# Patient Record
Sex: Male | Born: 1980 | Race: White | Hispanic: No | Marital: Married | Smoking: Never smoker
Health system: Southern US, Community
[De-identification: ages and names within clinical notes are randomized; demographics above are authoritative.]

## PROBLEM LIST (undated history)

## (undated) DIAGNOSIS — G629 Polyneuropathy, unspecified: Secondary | ICD-10-CM

## (undated) HISTORY — DX: Polyneuropathy, unspecified: G62.9

---

## 2012-09-30 ENCOUNTER — Encounter (HOSPITAL_BASED_OUTPATIENT_CLINIC_OR_DEPARTMENT_OTHER): Payer: BC Managed Care – PPO | Attending: General Surgery

## 2012-09-30 DIAGNOSIS — G589 Mononeuropathy, unspecified: Secondary | ICD-10-CM | POA: Insufficient documentation

## 2012-09-30 DIAGNOSIS — L97509 Non-pressure chronic ulcer of other part of unspecified foot with unspecified severity: Secondary | ICD-10-CM | POA: Insufficient documentation

## 2012-10-16 ENCOUNTER — Encounter (HOSPITAL_BASED_OUTPATIENT_CLINIC_OR_DEPARTMENT_OTHER): Payer: BC Managed Care – PPO | Attending: General Surgery

## 2013-08-12 ENCOUNTER — Ambulatory Visit (INDEPENDENT_AMBULATORY_CARE_PROVIDER_SITE_OTHER): Payer: BC Managed Care – PPO

## 2013-08-12 VITALS — BP 122/70 | HR 86 | Resp 12

## 2013-08-12 DIAGNOSIS — B351 Tinea unguium: Secondary | ICD-10-CM

## 2013-08-12 DIAGNOSIS — L97509 Non-pressure chronic ulcer of other part of unspecified foot with unspecified severity: Secondary | ICD-10-CM

## 2013-08-12 DIAGNOSIS — G609 Hereditary and idiopathic neuropathy, unspecified: Secondary | ICD-10-CM

## 2013-08-12 DIAGNOSIS — G629 Polyneuropathy, unspecified: Secondary | ICD-10-CM

## 2013-08-12 MED ORDER — TAVABOROLE 5 % EX SOLN: CUTANEOUS | Status: DC

## 2013-08-12 NOTE — Progress Notes (Signed)
   Subjective:    Patient ID: Harold Miller, male    DOB: 03/09/1981, 33 y.o.   MRN: [id number]  HPI '' RT FOOT GREAT TOE HAVE AN OPEN SORE FOR 1.5 YEAR. THE TOE IS BEEN THE SAME, BUT IS NOT GETTING WORSE.  DR. PRESCRIBE BACTRIM BUT THE TOE IS NOT GETTING BETTER.    Review of Systems  All other systems reviewed and are negative.       Objective:   Physical Exam Okay objective findings follows vascular status intact pedal pulses palpable DP and PT posterior were for patient has a hemorrhage a blister first digit IP joint plantar medial right great toe. Patient's emergency room and placed months antibiotic ointment is to be getting better at this point there was a red streak which is clear neck she before he started on antibiotic. Currently is been applying Bactroban ointment and dressings to the toe patient wearing a Darco wedge shoe to try to offload. Patient does have a history of some neuropathy decreased sensations confirmed on Semmes Weinstein testing otherwise normal plantar response are noted DTRs not listed. Patient does drink alcohol although not a huge amount of pretty consistent of alcohol daily. 3 or 4 drinks a day. This may be contributing to his neuropathy and indicates he had nerve conduction studies done in the past summer he rash broke was told it wasn't conclusive formula re\re center for testing its been a year and half and hasn't nerve problem seems to be getting worse. Patient also thick and dystrophy of the nails per to hallux nails bilateral lesser digits with thickening yellowing and brittleness requesting topical treatment tried topical creams over-the-counter with no success orthopedic biomechanical exam reveals rectus foot type ankle mid tarsus subtalar joint motions normal there is approximately 0.5 cm ulceration plantar medial hallux IP joint with hemorrhage a keratosis around the periphery.      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 I does or gauze dressing are applied patient is using Bactroban ointment at home and doing dressing changes daily as instructed will allow for handicap parking placard may stop working for 2 weeks to allow this to heal he does a lot of walking around [employer] campus every day and this is likely not to allow this wound to heal. Stress to that offloading with neuropathy to be the most important treatment aspect get this to heal despite the diabetic. At this time we'll continue to be person the ulcer is debrided Iodosorb dressing is applied recheck in 3-4 weeks for followup prescription for Kerydin topical nail antifungal is also given and 40 to pharmacy. Next  Alvan Dameichard Rina Adney DPM

## 2013-08-12 NOTE — Patient Instructions (Signed)
ANTIBACTERIAL SOAP INSTRUCTIONS  THE DAY AFTER PROCEDURE  Please follow the instructions your doctor has marked.   Shower as usual. Before getting out, place a drop of antibacterial liquid soap (Dial) on a wet, clean washcloth.  Gently wipe washcloth over affected area.  Afterward, rinse the area with warm water.  Blot the area dry with a soft cloth and cover with antibiotic ointment (neosporin, polysporin, bacitracin) and band aid or gauze and tape  Place 3-4 drops of antibacterial liquid soap in a quart of warm tap water.  Submerge foot into water for 20 minutes.  If bandage was applied after your procedure, leave on to allow for easy lift off, then remove and continue with soak for the remaining time.  Next, blot area dry with a soft cloth and cover with a bandage.  After washing and drying thoroughly apply

## 2013-09-09 ENCOUNTER — Ambulatory Visit (INDEPENDENT_AMBULATORY_CARE_PROVIDER_SITE_OTHER): Payer: BC Managed Care – PPO

## 2013-09-09 VITALS — BP 118/71 | HR 82 | Temp 98.4°F | Resp 18

## 2013-09-09 DIAGNOSIS — G629 Polyneuropathy, unspecified: Secondary | ICD-10-CM

## 2013-09-09 DIAGNOSIS — L97509 Non-pressure chronic ulcer of other part of unspecified foot with unspecified severity: Secondary | ICD-10-CM

## 2013-09-09 NOTE — Patient Instructions (Signed)
ANTIBACTERIAL SOAP INSTRUCTIONS  THE DAY AFTER PROCEDURE  Please follow the instructions your doctor has marked.   Shower as usual. Before getting out, place a drop of antibacterial liquid soap (Dial) on a wet, clean washcloth.  Gently wipe washcloth over affected area.  Afterward, rinse the area with warm water.  Blot the area dry with a soft cloth and cover with antibiotic ointment (neosporin, polysporin, bacitracin) and band aid or gauze and tape  Place 3-4 drops of antibacterial liquid soap in a quart of warm tap water.  Submerge foot into water for 20 minutes.  If bandage was applied after your procedure, leave on to allow for easy lift off, then remove and continue with soak for the remaining time.  Next, blot area dry with a soft cloth and cover with a bandage.  Apply other medications as directed by your doctor, such as cortisporin otic solution (eardrops) or neosporin antibiotic ointment  Continue cleansing daily applying Bactroban ointment and a gauze and Band-Aid dressing and cushioned pad

## 2013-09-09 NOTE — Progress Notes (Signed)
   Subjective:    Patient ID: Harold Miller, male    DOB: 09/02/1980, 33 y.o.   MRN: [id number]  HPI  It may be a little better and there is not much draining on my big toe on the right    Review of Systems no systemic changes or findings noted     Objective:   Physical Exam Neurovascular status is intact and unchanged pedal pulses are palpable DP and PT +2/4 bilateral patient does continue to have a small ulceration about 2-3 mm ulceration on the hallux IP joint right great toe is greatly improved work as previously continue with Silvadene or Bactroban and gauze dressings daily some additional tube foam padding is also dispensed patient will bring shoes with the next visit may try to provided with some diabetic or Plastizote accommodative insoles for her shoes to prevent recurrence of ulceration       Assessment & Plan:  Ulcer hallux IP joint right great toe secondary peripheral neuropathy the ulcer is debrided down to dermal level does not go full thickness down to subcutaneous tissue at this point to with lumicain Silvadene and gauze dressing is applied reappointed in 2 weeks for pressure in 3 or 4 weeks for followup and continue with wound care as instructed may be candidate for diabetic insoles per memory foam insoles in the shoes  Alvan Dameichard Harjas Biggins DPM

## 2013-10-14 ENCOUNTER — Ambulatory Visit (INDEPENDENT_AMBULATORY_CARE_PROVIDER_SITE_OTHER): Payer: BC Managed Care – PPO

## 2013-10-14 VITALS — BP 113/70 | HR 80 | Resp 12

## 2013-10-14 DIAGNOSIS — G629 Polyneuropathy, unspecified: Secondary | ICD-10-CM

## 2013-10-14 DIAGNOSIS — L97509 Non-pressure chronic ulcer of other part of unspecified foot with unspecified severity: Secondary | ICD-10-CM

## 2013-10-14 NOTE — Patient Instructions (Signed)
ANTIBACTERIAL SOAP INSTRUCTIONS  THE DAY AFTER PROCEDURE  Please follow the instructions your doctor has marked.   Shower as usual. Before getting out, place a drop of antibacterial liquid soap (Dial) on a wet, clean washcloth.  Gently wipe washcloth over affected area.  Afterward, rinse the area with warm water.  Blot the area dry with a soft cloth and cover with antibiotic ointment (neosporin, polysporin, bacitracin) and band aid or gauze and tape  Place 3-4 drops of antibacterial liquid soap in a quart of warm tap water.  Submerge foot into water for 20 minutes.  If bandage was applied after your procedure, leave on to allow for easy lift off, then remove and continue with soak for the remaining time.  Next, blot area dry with a soft cloth and cover with a bandage.  Apply other medications as directed by your doctor, such as cortisporin otic solution (eardrops) or neosporin antibiotic ointment  Cleanse wound daily was soap and water and dried thoroughly apply antibiotic ointment such as muprocin and a gauze dressing as indicated until healed

## 2013-10-14 NOTE — Progress Notes (Signed)
   Subjective:    Patient ID: Harold Miller, male    DOB: [birth year], 33 y.o.   MRN: [id number]  HPI ''rt foot great toe is looking a little better.''   Review of Systems no new findings or systemic changes noted     Objective:   Physical Exam Neurovascular status is intact pedal pulses palpable patient does have decreased epicritic sensation confirmed to the toes the ulceration of the hallux IP joint his. Improved has been using Bactroban or Silvadene cream and dressing changes daily hemorrhage a keratoses debrided away this time down to dermal level still by 1 mm portion of ulceration present suggested continue with the Silvadene or Bactroban and Band-Aid dressing daily he may resume with a week resumed running [place] a bandage dressing tube foam pad. Discharge to an as-needed basis for followup      Assessment & Plan:  Assessment resolving ulceration right great toe secondary to peripheral neuropathy there support keratoses and ulceration debrided down to dermal level a 1 mm ulcer still present maintain in a bike ointment and a Band-Aid for another couple weeks and then discontinue if no additional discharge drainage it has not drained for 2 weeks now. 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 Discharge to an as-needed basis for followup at this time. Improving onychomycosis of nails also resolved ulceration right hallux  Alvan Dame DPM

## 2015-04-04 DIAGNOSIS — S52123A Displaced fracture of head of unspecified radius, initial encounter for closed fracture: Secondary | ICD-10-CM | POA: Insufficient documentation

## 2015-09-21 ENCOUNTER — Encounter: Payer: Self-pay | Admitting: Sports Medicine

## 2015-09-21 ENCOUNTER — Ambulatory Visit (INDEPENDENT_AMBULATORY_CARE_PROVIDER_SITE_OTHER): Payer: BC Managed Care – PPO | Admitting: Sports Medicine

## 2015-09-21 DIAGNOSIS — M79674 Pain in right toe(s): Secondary | ICD-10-CM

## 2015-09-21 DIAGNOSIS — L603 Nail dystrophy: Secondary | ICD-10-CM | POA: Diagnosis not present

## 2015-09-21 DIAGNOSIS — G588 Other specified mononeuropathies: Secondary | ICD-10-CM | POA: Diagnosis not present

## 2015-09-21 DIAGNOSIS — M79675 Pain in left toe(s): Secondary | ICD-10-CM | POA: Diagnosis not present

## 2015-09-21 NOTE — Progress Notes (Signed)
Patient ID: Harold Miller, male   DOB: 01-11-1981, 35 y.o.   MRN: [id number] Subjective: Harold Miller is a 35 y.o. male patient seen today in office with complaint of painful thickened and discolored nails. Patient is desiring treatment for nail changes; has tried OTC topicals/Medication in the past with no improvement. Patient desires big toenail removed and to figure out if there is any fungus in the lesser nails. Reports that nails are becoming difficult to manage because of the thickness. Patient has no other pedal complaints at this time.   Patient Active Problem List   Diagnosis Date Noted  . Closed fracture of head of radius 04/04/2015    Current Outpatient Prescriptions on File Prior to Visit  Medication Sig Dispense Refill  . mupirocin ointment (BACTROBAN) 2 % Place 1 application into the nose 2 (two) times daily.    Marland Kitchen sulfamethoxazole-trimethoprim (BACTRIM DS) 800-160 MG per tablet     . Tavaborole (KERYDIN) 5 % SOLN Apply 1 drop each affected nail once daily for 12 months 10 mL 10   No current facility-administered medications on file prior to visit.    No Known Allergies  Objective: Physical Exam  General: Well developed, nourished, no acute distress, awake, alert and oriented x 3  Vascular: Dorsalis pedis artery 2/4 bilateral, Posterior tibial artery 2/4 bilateral, skin temperature warm to warm proximal to distal bilateral lower extremities, no varicosities, pedal hair present bilateral.  Neurological: Gross sensation present via light touch bilateral. Protective and epicritic sensation severely diminished. Vibratory absent bilateral. History of idiopathic neuropathy  Dermatological: Skin is warm, dry, and supple bilateral, Nails 1-10 are tender, short thick, and discolored with mild subungal debris with the most involved nails being bilateral hallux left third through fifth toes, right second through fifth toes, no webspace macerations present bilateral, no open lesions  present bilateral, no callus/corns/hyperkeratotic tissue present bilateral. No signs of infection bilateral.  Musculoskeletal: No boney deformities noted bilateral. Muscular strength within normal limits without painon range of motion. No pain with calf compression bilateral.  Assessment and Plan:  Problem List Items Addressed This Visit    None    Visit Diagnoses    Nail dystrophy    -  Primary    Relevant Orders    Fungus Culture with Smear    Other mononeuropathy        Relevant Orders    Fungus Culture with Smear    Toe pain, bilateral        Relevant Orders    Fungus Culture with Smear       -Examined patient -Discussed treatment options for painful dystrophic nails  -Patient opt for permanent nail avulsion of bilateral hallux nails and for fungal culture of remaining nails - After a verbal consent, injected 3 ml of a 50:50 mixture of 2% plain  lidocaine and 0.5% plain marcaine in a normal hallux block fashion. Next, a  betadine prep was performed. Anesthesia was tested and found to be appropriate.  The right and left hallux nail was then incised from the hyponychium to the epinychium and cleared from the field. The area was curretted for any remaining nail or spicules. Phenol application performed and the area was then flushed with alcohol and dressed with antibiotic cream and a dry sterile dressing. -Patient was instructed to leave the dressing intact for today and begin soaking  in a weak solution of betadine or Epson salt and water tomorrow. Patient was instructed to  soak for 15 minutes each day and  apply neosporin and a gauze or bandaid dressing each day. -Patient was instructed to monitor the toe for signs of infection and return to office if toe becomes red, hot or swollen. -Patient may take Motrin or Tylenol as needed for pain and was encouraged to ice and elevate if needed with care in the setting of neuropathy -Advised patient to refrain from high impact exercise at  this time. May resume after 1 week. -[facility]ungal [facility] of the lesser nails [facility] obtained [facility] -Patient to return in 4 weeks for follow up evaluation and discussion of fungal culture results or sooner if symptoms worsen.  Asencion Islamitorya Kimala Horne, DPM

## 2015-10-19 ENCOUNTER — Encounter: Payer: Self-pay | Admitting: Sports Medicine

## 2015-10-19 ENCOUNTER — Ambulatory Visit (INDEPENDENT_AMBULATORY_CARE_PROVIDER_SITE_OTHER): Payer: BC Managed Care – PPO | Admitting: Sports Medicine

## 2015-10-19 DIAGNOSIS — M79675 Pain in left toe(s): Secondary | ICD-10-CM

## 2015-10-19 DIAGNOSIS — Z9889 Other specified postprocedural states: Secondary | ICD-10-CM

## 2015-10-19 DIAGNOSIS — G588 Other specified mononeuropathies: Secondary | ICD-10-CM

## 2015-10-19 DIAGNOSIS — M79674 Pain in right toe(s): Secondary | ICD-10-CM

## 2015-10-19 DIAGNOSIS — B351 Tinea unguium: Secondary | ICD-10-CM

## 2015-10-19 LAB — HEPATIC FUNCTION PANEL
ALBUMIN: 4.8 g/dL (ref 3.5–5.5)
ALT: 22 IU/L (ref 0–44)
AST: 18 IU/L (ref 0–40)
Alkaline Phosphatase: 134 IU/L — ABNORMAL HIGH (ref 39–117)
Bilirubin Total: 0.7 mg/dL (ref 0.0–1.2)
Bilirubin, Direct: 0.14 mg/dL (ref 0.00–0.40)
Total Protein: 7.3 g/dL (ref 6.0–8.5)

## 2015-10-19 NOTE — Progress Notes (Signed)
Patient ID: Harold Miller, male   DOB: 12/31/1980, 35 y.o.   MRN: [id number] Subjective: Harold Miller is a 35 y.o. male patient returns to office today for follow up evaluation after having Right and Left Hallux total permanent nail avulsion performed on 09-21-15 Patient has been soaking using epsom salt and applying topical antibiotic covered with bandaid daily up until recently because his skin was getting macerated. Patient deniesfever/chills/nausea/vomitting/any other related constitutional symptoms at this time. Patient is also here for fungal culture results from lesser nails.   Patient Active Problem List   Diagnosis Date Noted  . Closed fracture of head of radius 04/04/2015    Current Outpatient Prescriptions on File Prior to Visit  Medication Sig Dispense Refill  . mupirocin ointment (BACTROBAN) 2 % Place 1 application into the nose 2 (two) times daily.    Marland Kitchen sulfamethoxazole-trimethoprim (BACTRIM DS) 800-160 MG per tablet     . Tavaborole (KERYDIN) 5 % SOLN Apply 1 drop each affected nail once daily for 12 months 10 mL 10   No current facility-administered medications on file prior to visit.    No Known Allergies  Objective:  General: Well developed, nourished, in no acute distress, alert and oriented x3   Dermatology: Skin is warm, dry and supple bilateral. Bilateral hallux nail beds appears to be clean, dry, with mild granular tissue and surrounding eschar/scab. (-) Erythema. (-) Edema. (-) serosanguous drainage present. The remaining nails appear unremarkable at this time free from ingrowing but short and thick with subungal debris. There are no other lesions or other signs of infection present.  Neurovascular status: Unchanged; No lower extremity swelling; No pain with calf compression bilateral.  Musculoskeletal: Decreased tenderness to palpation of the Bilateral hallux nail beds. Muscular strength within normal limits bilateral.   Assesement and Plan: Problem List Items  Addressed This Visit    None    Visit Diagnoses    Dermatophytosis of nail    -  Primary    Relevant Orders    Hepatic Function Panel    S/P nail surgery        Other mononeuropathy        Toe pain, bilateral           -Examined patient  -Cleansed right andleft hallux nail beds and gently scrubbed with peroxide and q-tip/curetted away eschar at site and applied antibiotic cream covered with bandaid.  -Discussed plan of care with patient. -May discontinue soaks. Apply neosporin as needed. May leave open to air at night. -Educated patient on long term care after nail surgery. -Patient was instructed to monitor the toe for reoccurrence and signs of infection; Patient advised to return to office or go to ER if toe becomes red, hot or swollen. -Fungal culture reviewed -Patient opt for Lamisil treatments; LFTs order; Results reviewed from labs with elevation in Alk Phosphatase; recommend to repeat labs in 6 weeks and to hold off on lamisil at this time and reconsider in 6 weeks.  Harold Miller, DPM

## 2015-11-30 ENCOUNTER — Telehealth: Payer: Self-pay | Admitting: Sports Medicine

## 2015-11-30 ENCOUNTER — Encounter: Payer: Self-pay | Admitting: Sports Medicine

## 2015-11-30 ENCOUNTER — Ambulatory Visit (INDEPENDENT_AMBULATORY_CARE_PROVIDER_SITE_OTHER): Payer: BC Managed Care – PPO | Admitting: Sports Medicine

## 2015-11-30 DIAGNOSIS — G588 Other specified mononeuropathies: Secondary | ICD-10-CM

## 2015-11-30 DIAGNOSIS — Z9889 Other specified postprocedural states: Secondary | ICD-10-CM

## 2015-11-30 DIAGNOSIS — M79674 Pain in right toe(s): Secondary | ICD-10-CM

## 2015-11-30 DIAGNOSIS — M79675 Pain in left toe(s): Secondary | ICD-10-CM

## 2015-11-30 DIAGNOSIS — B351 Tinea unguium: Secondary | ICD-10-CM | POA: Diagnosis not present

## 2015-11-30 LAB — HEPATIC FUNCTION PANEL
ALT: 26 IU/L (ref 0–44)
AST: 18 IU/L (ref 0–40)
Albumin: 4.6 g/dL (ref 3.5–5.5)
Alkaline Phosphatase: 135 IU/L — ABNORMAL HIGH (ref 39–117)
BILIRUBIN, DIRECT: 0.13 mg/dL (ref 0.00–0.40)
Bilirubin Total: 0.6 mg/dL (ref 0.0–1.2)
TOTAL PROTEIN: 6.7 g/dL (ref 6.0–8.5)

## 2015-11-30 NOTE — Progress Notes (Signed)
Patient ID: Harold Miller, male   DOB: 12/20/1980, 35 y.o.   MRN: [id number]   Subjective: Harold AmelKevin Boliver is a 35 y.o. male patient returns to office today for follow up evaluation after having Right and Left Hallux total permanent nail avulsion performed on 09-21-15. Patient is here for reevaluation and for repeat blood tests to consider oral medication option. Patient deniesfever/chills/nausea/vomitting/any other related constitutional symptoms at this time.  Patient Active Problem List   Diagnosis Date Noted  . Closed fracture of head of radius 04/04/2015    Current Outpatient Prescriptions on File Prior to Visit  Medication Sig Dispense Refill  . mupirocin ointment (BACTROBAN) 2 % Place 1 application into the nose 2 (two) times daily.    Marland Kitchen. sulfamethoxazole-trimethoprim (BACTRIM DS) 800-160 MG per tablet     . Tavaborole (KERYDIN) 5 % SOLN Apply 1 drop each affected nail once daily for 12 months 10 mL 10   No current facility-administered medications on file prior to visit.    No Known Allergies  Objective:  General: Well developed, nourished, in no acute distress, alert and oriented x3   Dermatology: Skin is warm, dry and supple bilateral. Bilateral hallux nail beds appears to be clean, dry, with surrounding eschar/scab. (-) Erythema. (-) Edema. (-) serosanguous drainage present. The remaining nails appear unremarkable at this time free from ingrowing but short and thick with subungal debris. There are no other lesions or other signs of infection present.  Neurovascular status: Unchanged; No lower extremity swelling; No pain with calf compression bilateral.  Musculoskeletal: Decreased tenderness to palpation of the Bilateral hallux nail beds. Muscular strength within normal limits bilateral.   Assesement and Plan: Problem List Items Addressed This Visit    None    Visit Diagnoses    Dermatophytosis of nail    -  Primary    Relevant Orders    Hepatic function panel    S/P nail  surgery        Toe pain, bilateral        Other mononeuropathy           -Examined patient  -Cleansed right andleft hallux nail beds and gently scrubbed with peroxide and q-tip/curetted away eschar at site;Nail sites are well healed -Discussed plan of care with patient for consideration of oral Lamisil for positive fungal culture of T [place] -Patient opt for repeat blood work for consideration of Lamisil advised patient once results are available, Will call patient to discuss Plan of care. Advised patient is LFTs are still increased will have Patient contact primary care doctor for possible labral ultrasound and further determination why his enzymes are still elevated. Patient to follow-up as needed or sooner if problems arise.  Asencion Islamitorya Tayvien Kane, DPM

## 2015-11-30 NOTE — Telephone Encounter (Signed)
Called and reviewed blood work with patient. Alkaline phosphatase is noted to be 135, increased by 1 since last month's Liver function panel. Advised patient to follow up with primary care doctor for further evaluation and consideration for further blood work and ultrasound to determine etiology of high alkaline phosphatase. At this time, Patient is not a good candidate for oral Lamisil due to elevated liver enzymes. I advised patient that once workup is completed and liver enzymes Return to normal, may re-consider Lamisil for nail fungus. I informed patient that I will send a copy of this note and recent chart note to primary care physician Dr. Donette LarryHusain.  -Dr. Marylene LandStover

## 2015-11-30 NOTE — Patient Instructions (Signed)
Long Term Care Instructions-Post Nail Surgery  You have had your ingrown toenail and root treated with a chemical.  This chemical causes a burn that will drain and ooze like a blister.  This can drain for 6-8 weeks or longer.  It is important to keep this area clean, covered, and follow the soaking instructions dispensed at the time of your surgery.  This area will eventually dry and form a scab.  Once the scab forms you no longer need to soak or apply a dressing.  If at any time you experience an increase in pain, redness, swelling, or drainage, you should contact the office as soon as possible.  Terbinafine oral granules What is this medicine? TERBINAFINE (TER bin a feen) is an antifungal medicine. It is used to treat certain kinds of fungal or yeast infections. This medicine may be used for other purposes; ask your health care provider or pharmacist if you have questions. What should I tell my health care provider before I take this medicine? They need to know if you have any of these conditions: -drink alcoholic beverages -kidney disease -liver disease -an unusual or allergic reaction to Terbinafine, other medicines, foods, dyes, or preservatives -pregnant or trying to get pregnant -breast-feeding How should I use this medicine? Take this medicine by mouth. Follow the directions on the prescription label. Hold packet with cut line on top. Shake packet gently to settle contents. Tear packet open along cut line, or use scissors to cut across line. Carefully pour the entire contents of packet onto a spoonful of a soft food, such as pudding or other soft, non-acidic food such as mashed potatoes (do NOT use applesauce or a fruit-based food). If two packets are required for each dose, you may either sprinkle the content of both packets on one spoonful of non-acidic food, or sprinkle the contents of both packets on two spoonfuls of non-acidic food. Make sure that no granules remain in the packet.  Swallow the mxiture of the food and granules without chewing. Take your medicine at regular intervals. Do not take it more often than directed. Take all of your medicine as directed even if you think you are better. Do not skip doses or stop your medicine early. Contact your pediatrician or health care professional regarding the use of this medicine in children. While this medicine may be prescribed for children as young as 4 years for selected conditions, precautions do apply. Overdosage: If you think you have taken too much of this medicine contact a poison control center or emergency room at once. NOTE: This medicine is only for you. Do not share this medicine with others. What if I miss a dose? If you miss a dose, take it as soon as you can. If it is almost time for your next dose, take only that dose. Do not take double or extra doses. What may interact with this medicine? Do not take this medicine with any of the following medications: -thioridazine This medicine may also interact with the following medications: -beta-blockers -caffeine -cimetidine -cyclosporine -MAOIs like Carbex, Eldepryl, Marplan, Nardil, and Parnate -medicines for fungal infections like fluconazole and ketoconazole -medicines for irregular heartbeat like amiodarone, flecainide and propafenone -rifampin -SSRIs like citalopram, escitalopram, fluoxetine, fluvoxamine, paroxetine and sertraline -tricyclic antidepressants like amitriptyline, clomipramine, desipramine, imipramine, nortriptyline, and others -warfarin This list may not describe all possible interactions. Give your health care provider a list of all the medicines, herbs, non-prescription drugs, or dietary supplements you use. Also tell them if you  smoke, drink alcohol, or use illegal drugs. Some items may interact with your medicine. What should I watch for while using this medicine? Your doctor may monitor your liver function. Tell your doctor right away if  you have nausea or vomiting, loss of appetite, stomach pain on your right upper side, yellow skin, dark urine, light stools, or are over tired. You need to take this medicine for 6 weeks or longer to cure the fungal infection. Take your medicine regularly for as long as your doctor or health care professional tells you to. What side effects may I notice from receiving this medicine? Side effects that you should report to your doctor or health care professional as soon as possible: -allergic reactions like skin rash or hives, swelling of the face, lips, or tongue -change in vision -dark urine -fever or infection -general ill feeling or flu-like symptoms -light-colored stools -loss of appetite, nausea -redness, blistering, peeling or loosening of the skin, including inside the mouth -right upper belly pain -unusually weak or tired -yellowing of the eyes or skin Side effects that usually do not require medical attention (report to your doctor or health care professional if they continue or are bothersome): -changes in taste -diarrhea -hair loss -muscle or joint pain -stomach upset This list may not describe all possible side effects. Call your doctor for medical advice about side effects. You may report side effects to FDA at 1-800-FDA-1088. Where should I keep my medicine? Keep out of the reach of children. Store at room temperature between 15 and 30 degrees C (59 and 86 degrees F). Throw away any unused medicine after the expiration date. NOTE: This sheet is a summary. It may not cover all possible information. If you have questions about this medicine, talk to your doctor, pharmacist, or health care provider.    2016, [employer]. (2007-07-10 17:25:48)

## 2016-05-27 ENCOUNTER — Ambulatory Visit
Admission: RE | Admit: 2016-05-27 | Discharge: 2016-05-27 | Disposition: A | Discharge status: Home / Self Care | Payer: BC Managed Care – PPO | Source: Ambulatory Visit | Attending: Nurse Practitioner | Admitting: Nurse Practitioner

## 2016-05-27 ENCOUNTER — Ambulatory Visit (INDEPENDENT_AMBULATORY_CARE_PROVIDER_SITE_OTHER): Payer: BC Managed Care – PPO | Admitting: Podiatry

## 2016-05-27 ENCOUNTER — Encounter: Payer: Self-pay | Admitting: Podiatry

## 2016-05-27 ENCOUNTER — Other Ambulatory Visit: Payer: Self-pay | Admitting: Nurse Practitioner

## 2016-05-27 VITALS — BP 121/75 | HR 71 | Resp 16

## 2016-05-27 DIAGNOSIS — S93509A Unspecified sprain of unspecified toe(s), initial encounter: Secondary | ICD-10-CM | POA: Diagnosis not present

## 2016-05-27 DIAGNOSIS — M79676 Pain in unspecified toe(s): Secondary | ICD-10-CM

## 2016-05-27 NOTE — Progress Notes (Signed)
   Subjective:    Patient ID: Harold Miller, male    DOB: 11/27/1980, 36 y.o.   MRN: [id number]  HPI this patient presents to the office with chief complaint of a discolored injured third toe, right foot. The discoloration is located over the joint nearest  the toenail. He says he injured it on exercise equipment 3 days ago. He went to heal and had x-rays taken and they diagnosed an avulsion fracture at the top of the DIPJ third toe, right foot. Patient says he says he does not experience any pain or discomfort. 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 He states he  has not provided any self treatment . He does give a history of having nail surgery performed by Dr. Marylene LandStover last year and the nail on the right great toe has regrown and he is not pleased with its appearance    Review of Systems  All other systems reviewed and are negative.      Objective:   Physical Exam GENERAL APPEARANCE: Alert, conversant. Appropriately groomed. No acute distress.  VASCULAR: Pedal pulses are  palpable at  [facility] bilateral.  Capillary refill time is immediate to all digits,  Normal temperature gradient.  Digital hair growth is present bilateral  NEUROLOGIC: sensation is normal to 5.07 monofilament at 5/5 sites bilateral.  Light touch is intact bilateral, Muscle strength normal.  MUSCULOSKELETAL: acceptable muscle strength, tone and stability bilateral.  Intrinsic muscluature intact bilateral.  Rectus appearance of foot and digits noted bilateral. Ecchymosis and swelling noted over the DIPJ dorsally the third toe, right foot. Hammertoe deformities noted  2,3,4 of the right foot  DERMATOLOGIC: skin color, texture, and turgor are within normal limits.  No preulcerative lesions or ulcers  are seen, no interdigital maceration noted.  No open lesions present.  Digital nails are asymptomatic. No drainage noted. Nail regrowth noted from the nailbed of the right hallux. No evidence of any redness,  swelling or infection        Assessment & Plan:  Ligament/toe sprain right foot.    IE  X-rays were reviewed and it showed only a small dorsal fleck of bone noted at the level of the DIPJ third toe, right foot. No evidence of any fracture to the phalanx third toe, right foot. Patient was dispensed a surgical shoe to be worn for the next 10 days. He was told he could return to activity, but I recommended he buddy tape his toe once he returns to activity.  I also used the dremel tool  and mechanically reduced the nail that was growing from the nailbed   Helane GuntherGregory Mayer DPM

## 2016-10-22 ENCOUNTER — Other Ambulatory Visit: Payer: Self-pay | Admitting: Internal Medicine

## 2016-10-22 ENCOUNTER — Ambulatory Visit
Admission: RE | Admit: 2016-10-22 | Discharge: 2016-10-22 | Disposition: A | Discharge status: Home / Self Care | Payer: BC Managed Care – PPO | Source: Ambulatory Visit | Attending: Internal Medicine | Admitting: Internal Medicine

## 2016-10-22 DIAGNOSIS — M549 Dorsalgia, unspecified: Secondary | ICD-10-CM

## 2016-10-22 DIAGNOSIS — R040 Epistaxis: Secondary | ICD-10-CM | POA: Insufficient documentation

## 2016-10-22 DIAGNOSIS — J301 Allergic rhinitis due to pollen: Secondary | ICD-10-CM | POA: Insufficient documentation

## 2017-04-14 ENCOUNTER — Ambulatory Visit
Admission: RE | Admit: 2017-04-14 | Discharge: 2017-04-14 | Disposition: A | Discharge status: Home / Self Care | Payer: BC Managed Care – PPO | Source: Ambulatory Visit | Attending: Internal Medicine | Admitting: Internal Medicine

## 2017-04-14 ENCOUNTER — Other Ambulatory Visit: Payer: Self-pay | Admitting: Internal Medicine

## 2017-04-14 DIAGNOSIS — M533 Sacrococcygeal disorders, not elsewhere classified: Secondary | ICD-10-CM

## 2018-02-06 IMAGING — DX DG FOOT COMPLETE 3+V*R*
3 series · 3 of 3 positions shown · non-contrast
Comparison: 07/22/2012

CLINICAL DATA: Injury RIGHT foot to 3 days ago, pain and bruising
RIGHT third toe

EXAM:
RIGHT FOOT COMPLETE - 3+ VIEW

[dg foot complete right (1 of 3)]
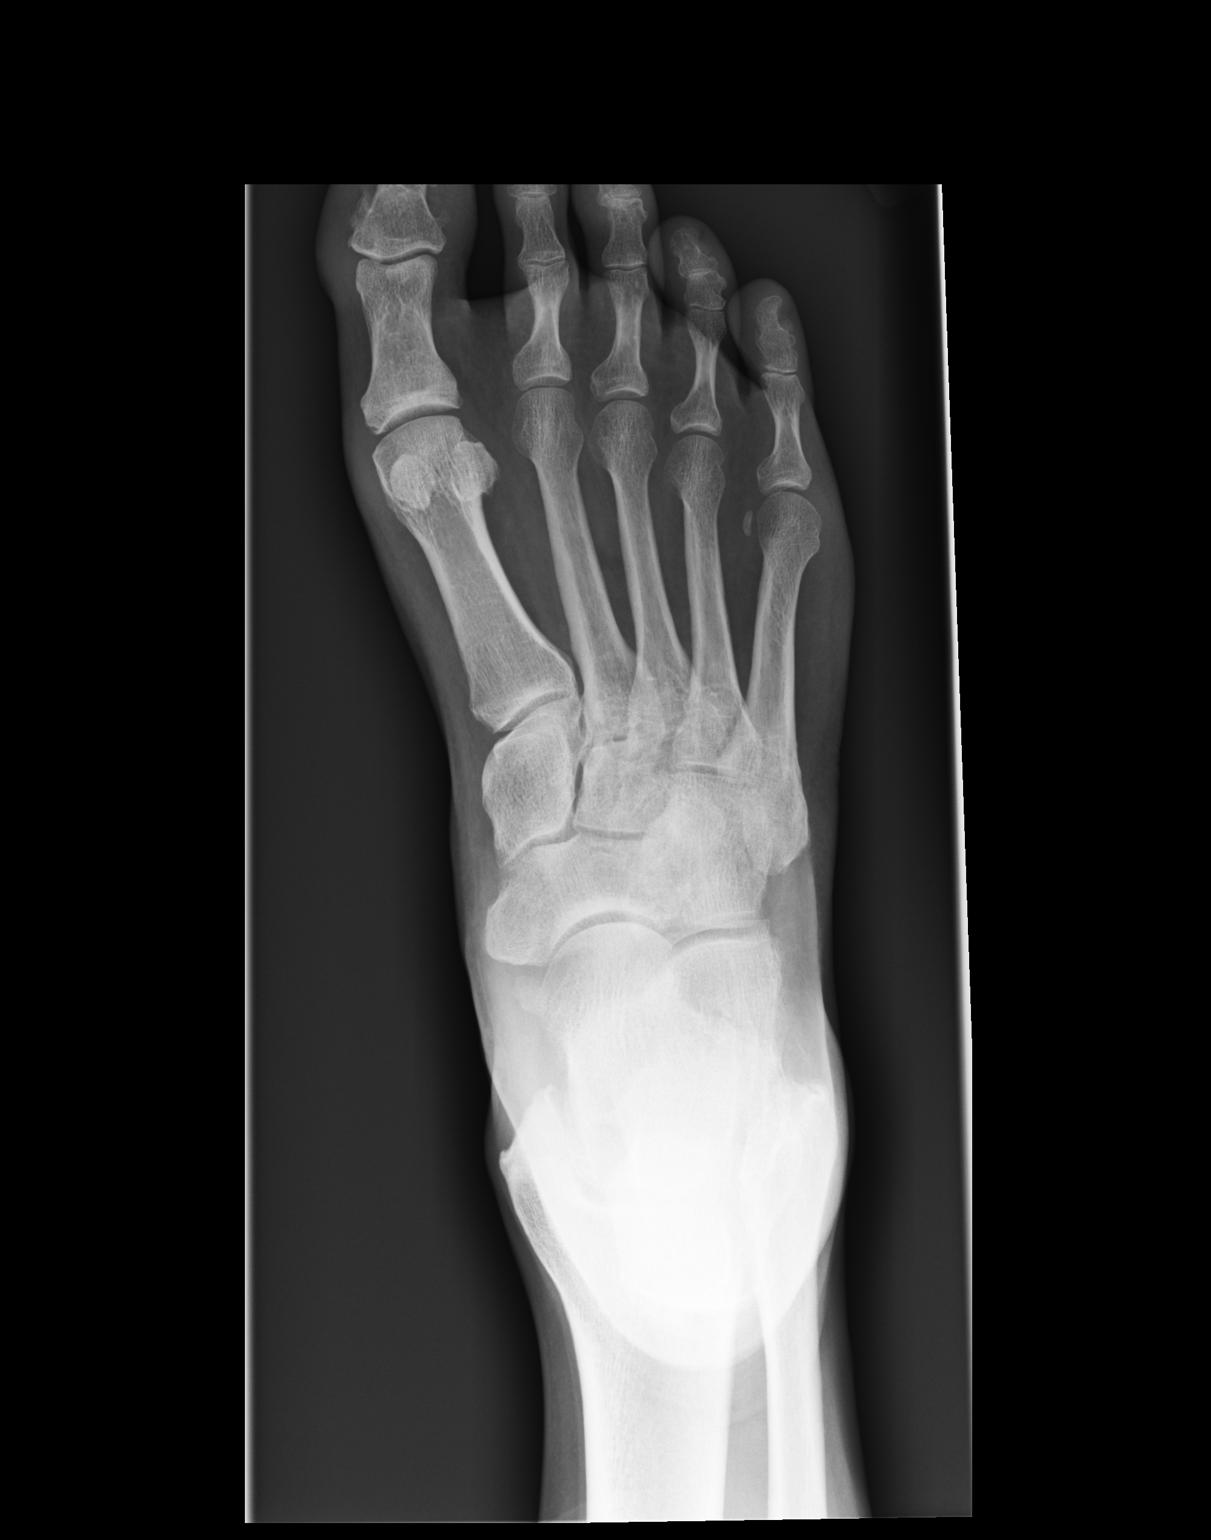

[dg foot complete right (2 of 3)]
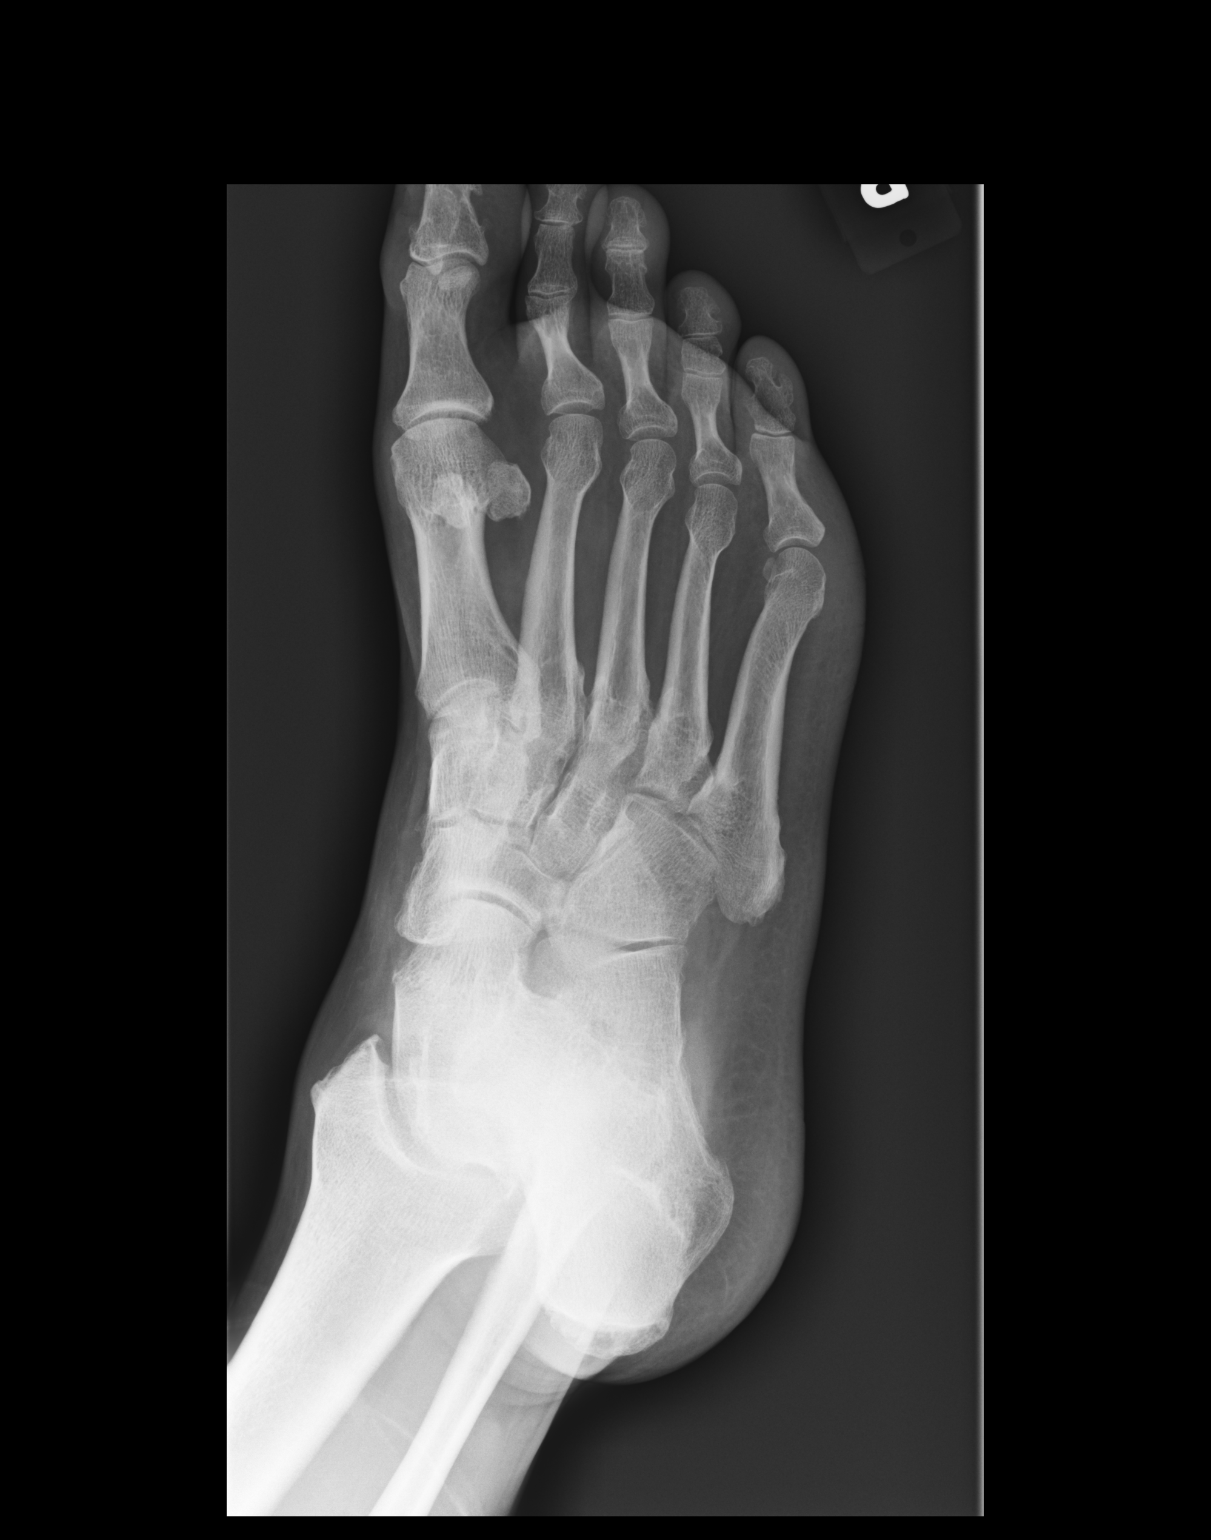

[dg foot complete right (3 of 3)]
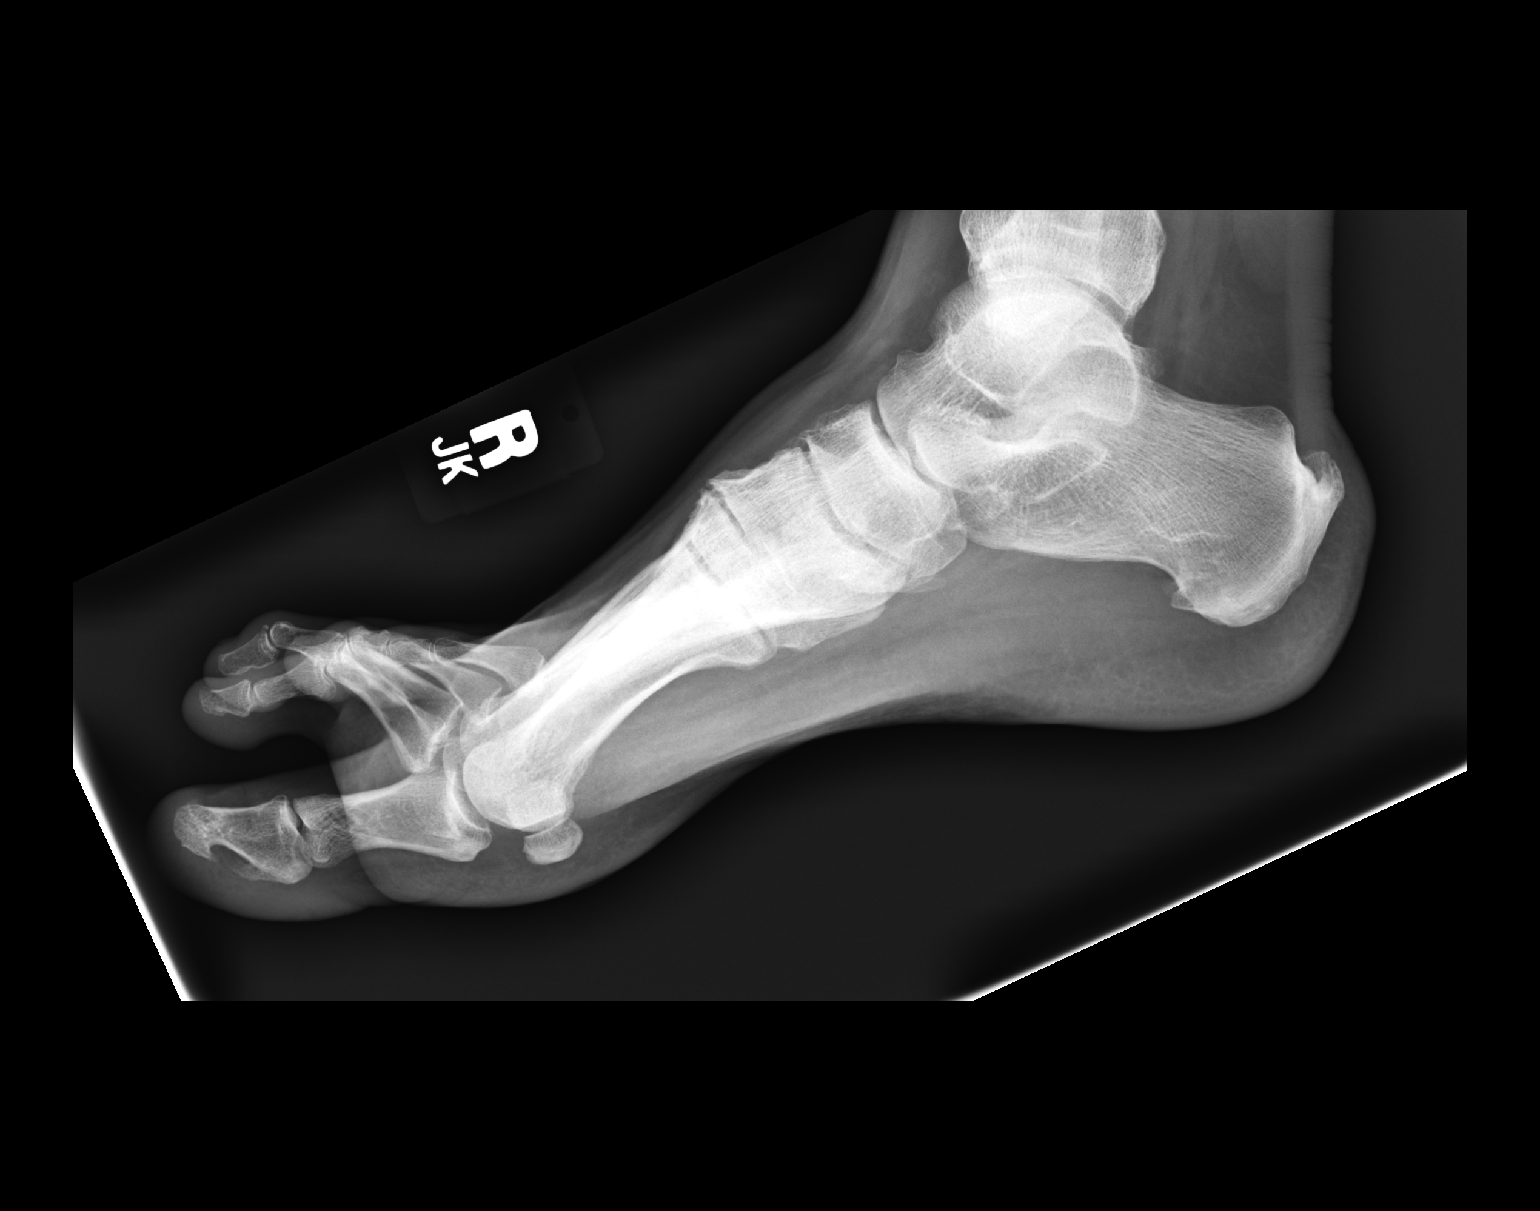

[3 of 3 positions shown; findings below may reference images not displayed]

FINDINGS: Osseous mineralization normal for technique.

Joint spaces preserved.

Soft tissue swelling at PIP joint RIGHT third toe.

Tiny dorsal plate avulsion fracture seen at the base of the distal
phalanx RIGHT third toe.

Mild degenerative changes noted at the TMT joints on lateral view.

Small plantar calcaneal spur.
IMPRESSION: Tiny dorsal plate avulsion fracture at base of distal phalanx RIGHT
third toe with overlying soft tissue swelling.

Small plantar calcaneal spur.

## 2018-07-28 ENCOUNTER — Other Ambulatory Visit: Payer: Self-pay | Admitting: Nurse Practitioner

## 2018-07-28 ENCOUNTER — Ambulatory Visit
Admission: RE | Admit: 2018-07-28 | Discharge: 2018-07-28 | Disposition: A | Discharge status: Home / Self Care | Payer: BC Managed Care – PPO | Source: Ambulatory Visit | Attending: Nurse Practitioner | Admitting: Nurse Practitioner

## 2018-07-28 DIAGNOSIS — R52 Pain, unspecified: Secondary | ICD-10-CM

## 2018-12-25 IMAGING — CR DG SI JOINTS 3+V
3 series · 3 of 3 positions shown · non-contrast
Comparison: None.

CLINICAL DATA: LEFT hip pain.  LEFT SI joint pain.

EXAM:
BILATERAL SACROILIAC JOINTS - 3+ VIEW

[t sacroiliac joints (1 of 3)]
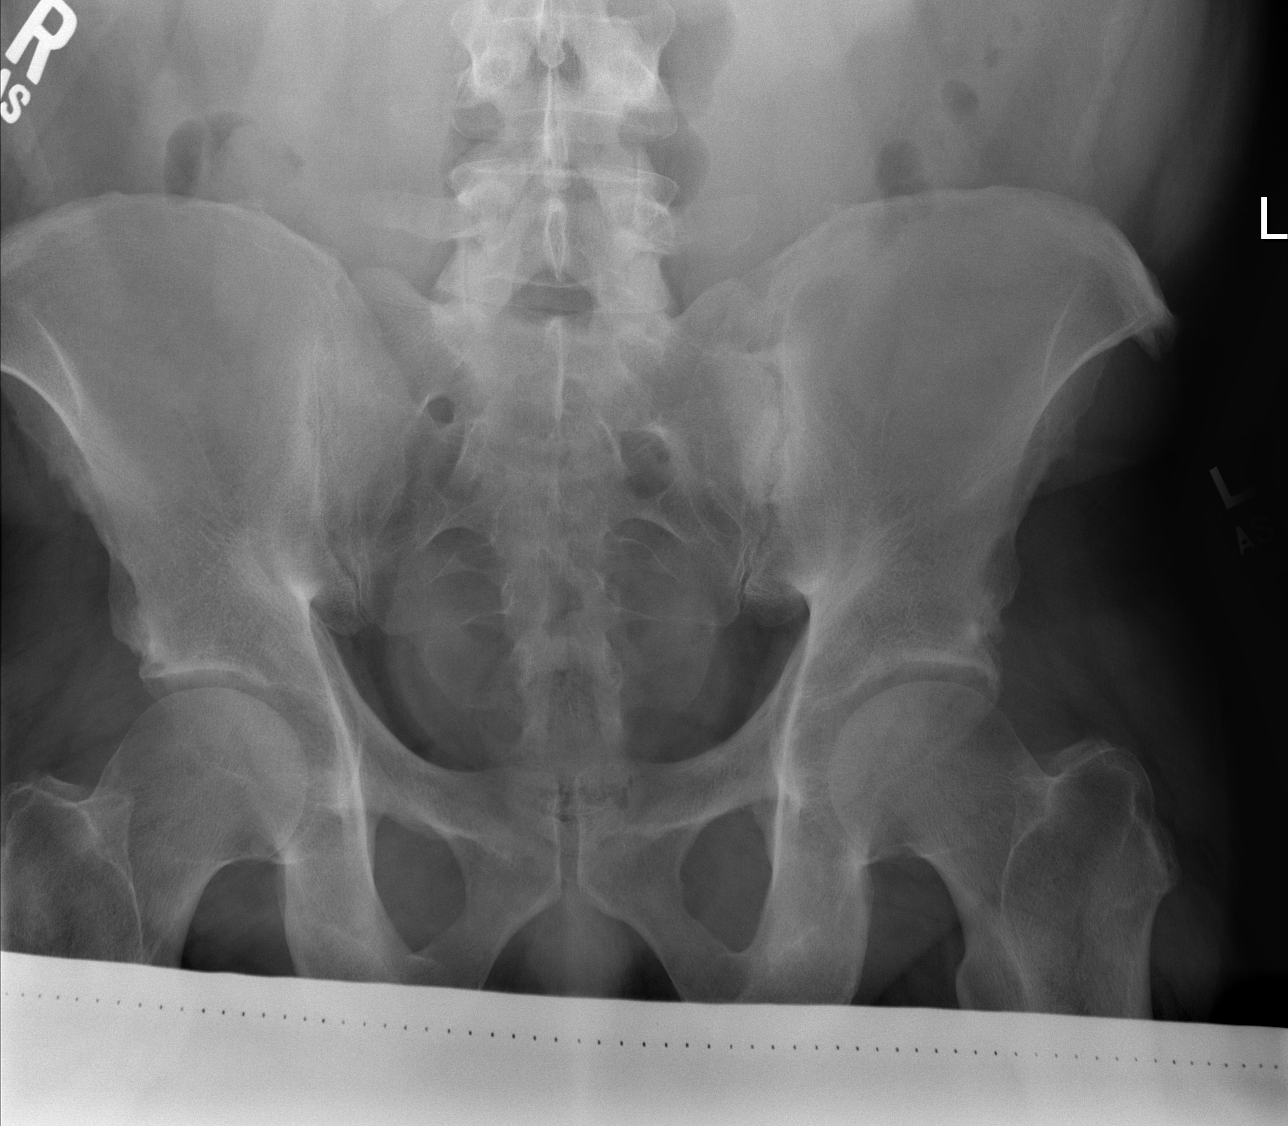

[t sacroiliac joints (2 of 3)]
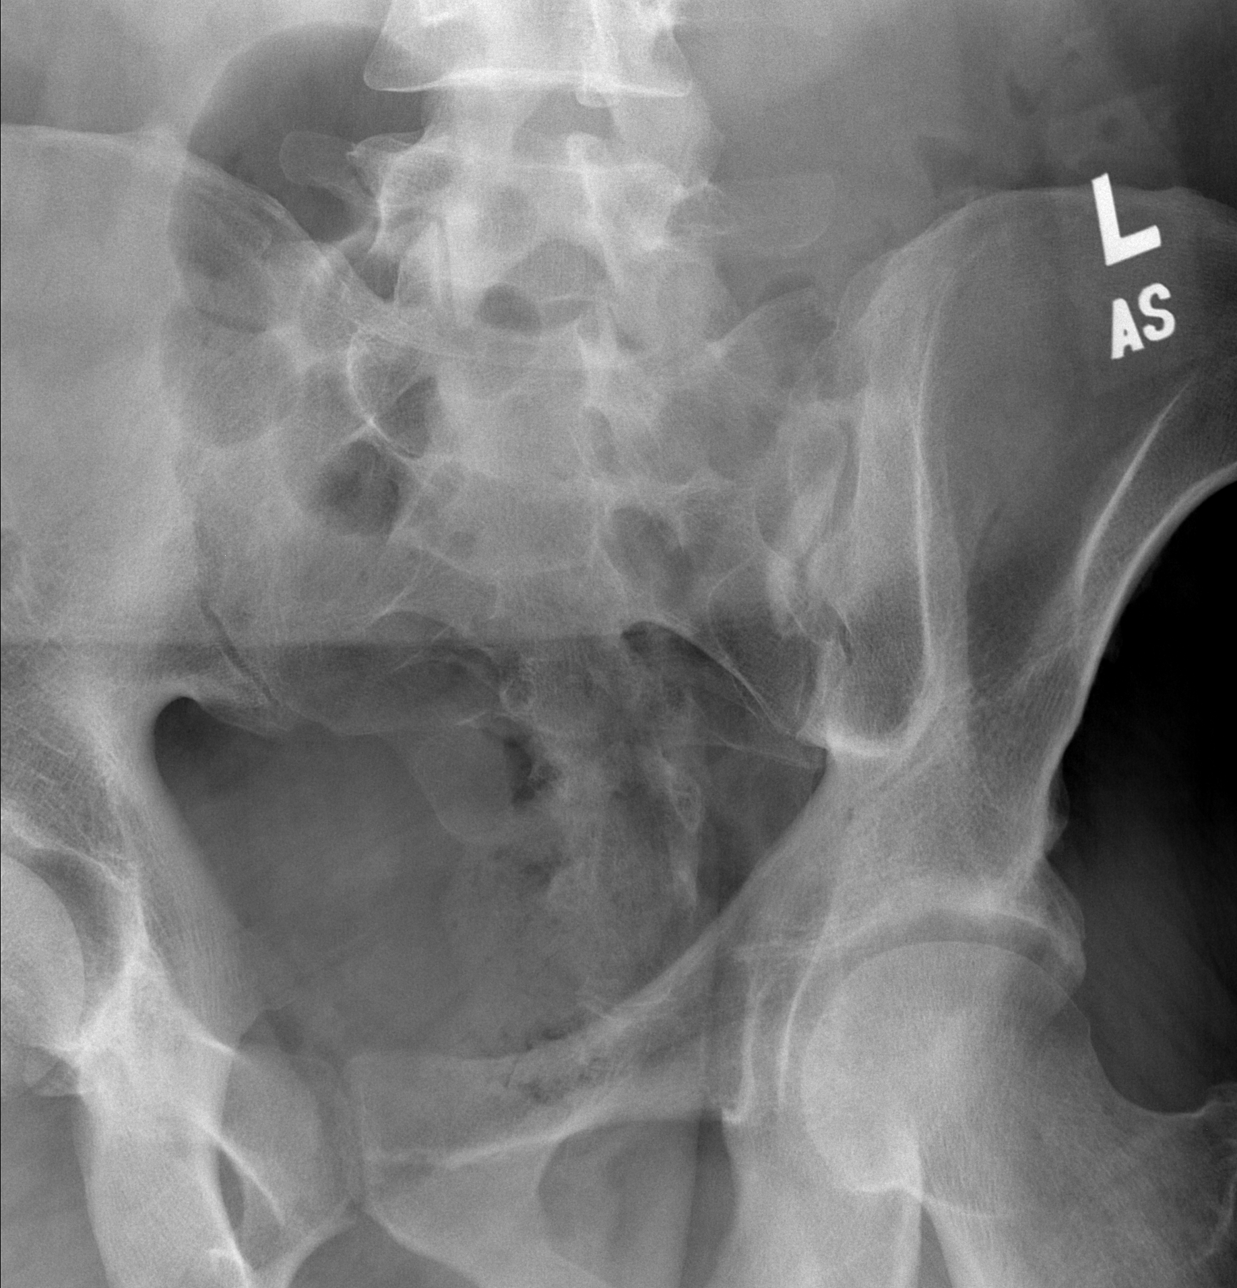

[t sacroiliac joints (3 of 3)]
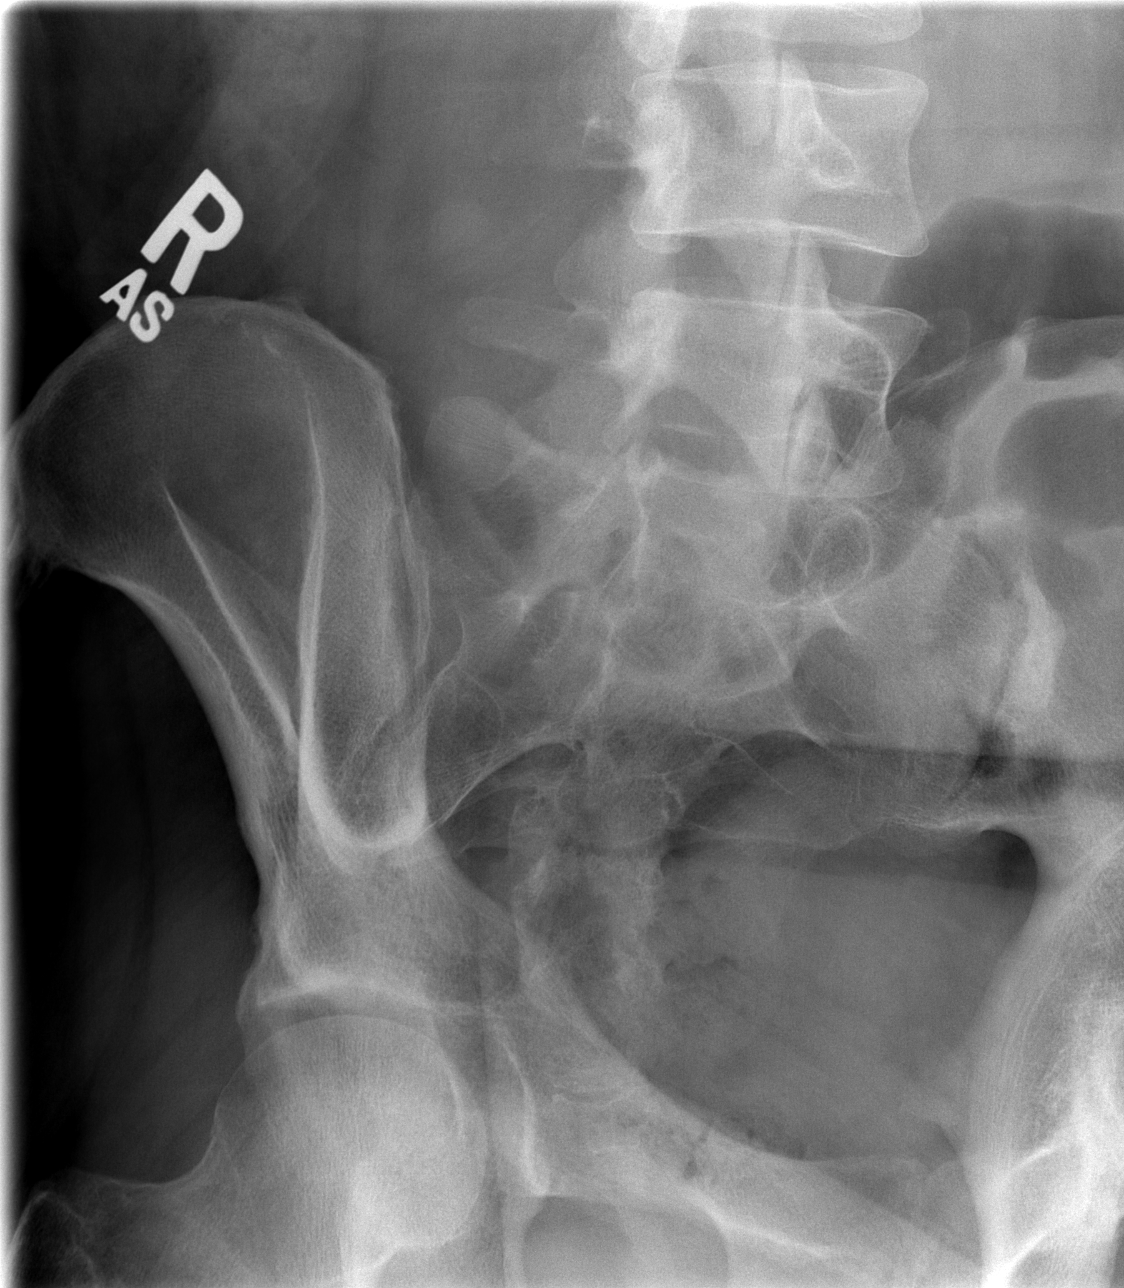

[3 of 3 positions shown; findings below may reference images not displayed]

FINDINGS: The sacroiliac joint may be mildly narrowed on the LEFT. There is
increasing sclerosis of both the sacral and iliac margins. Similar
less severe changes are suggested on the RIGHT.
IMPRESSION: Query BILATERAL sacroiliitis, LEFT greater than RIGHT.

## 2020-04-18 ENCOUNTER — Encounter: Payer: Self-pay | Admitting: Sports Medicine

## 2020-04-18 ENCOUNTER — Ambulatory Visit: Payer: BC Managed Care – PPO | Admitting: Sports Medicine

## 2020-04-18 ENCOUNTER — Other Ambulatory Visit: Payer: Self-pay

## 2020-04-18 DIAGNOSIS — B351 Tinea unguium: Secondary | ICD-10-CM

## 2020-04-18 DIAGNOSIS — L608 Other nail disorders: Secondary | ICD-10-CM | POA: Diagnosis not present

## 2020-04-18 DIAGNOSIS — M79674 Pain in right toe(s): Secondary | ICD-10-CM | POA: Diagnosis not present

## 2020-04-18 NOTE — Progress Notes (Signed)
Subjective: Harold Miller is a 38 y.o. male patient presents to office today complaining some pain and thickness to the right great toe reports that he has neuropathy but denies diabetes and states that he had the right great toenail removed 10 to 12 years ago but it grew back thickened color and he wants it removed again.  Patient denies pedal complaints at this time.  Review of systems noncontributory.  Patient Active Problem List   Diagnosis Date Noted  . Recurrent epistaxis 10/22/2016  . Seasonal allergic rhinitis due to pollen 10/22/2016  . Closed fracture of head of radius 04/04/2015    No current outpatient medications on file prior to visit.   No current facility-administered medications on file prior to visit.    No Known Allergies  Objective:  There were no vitals filed for this visit.  General: Well developed, nourished, in no acute distress, alert and oriented x3   Dermatology: Skin is warm, dry and supple bilateral.  Right hallux nail appears to be moderately thickened with subungual debris and dried blood within the nailbed. (-) Erythema. (-) Edema. (-) serosanguous drainage present. The remaining nails appear unremarkable at this time. There are no open sores, lesions or other signs of infection present.  Reactive keratosis noted to first toes bilateral.  Vascular: Dorsalis Pedis artery and Posterior Tibial artery pedal pulses are 2/4 bilateral with immedate capillary fill time. Pedal hair growth present. No lower extremity edema.   Neruologic: Grossly intact via light touch bilateral.  History of neuropathy.  Musculoskeletal: Tenderness to palpation of the right hallux nail.  Muscular strength within normal limits in all groups bilateral.   Assesement and Plan: Problem List Items Addressed This Visit    None    Visit Diagnoses    Nail deformity    -  Primary   Nail fungus       Toe pain, right          -Discussed treatment alternatives and plan of care;  Explained permanent/temporary nail avulsion and post procedure course to patient. Patient elects for total removal of right hallux use of phenol. - After a verbal and written consent, injected 3 ml of a 50:50 mixture of 2% plain lidocaine and 0.5% plain marcaine in a normal hallux block fashion. Next, a betadine prep was performed. Anesthesia was tested and found to be appropriate. The entire right hallux nail was then incised from the hyponychium to the epinychium. The offending nail border was removed and cleared from the field. The area was curretted for any remaining nail or spicules. Phenol application performed and the area was then flushed with alcohol and dressed with antibiotic cream and a dry sterile dressing. -Patient was instructed to leave the dressing intact for today and begin soaking  in a weak solution of betadine or Epsom salt and water tomorrow. Patient was instructed to soak for 15-20 minutes each day and apply neosporin/corticosporin and a gauze or bandaid dressing each day. -Patient was instructed to monitor the toe for signs of infection and return to office if toe becomes red, hot or swollen. -Advised ice, elevation, and tylenol or motrin if needed for pain.  -Patient is to return in 2 weeks for follow up care/nail check or sooner if problems arise.  Asencion Islam, DPM

## 2020-05-02 ENCOUNTER — Other Ambulatory Visit: Payer: Self-pay

## 2020-05-02 ENCOUNTER — Telehealth (INDEPENDENT_AMBULATORY_CARE_PROVIDER_SITE_OTHER): Payer: BC Managed Care – PPO | Admitting: Sports Medicine

## 2020-05-02 ENCOUNTER — Encounter: Payer: Self-pay | Admitting: Sports Medicine

## 2020-05-02 DIAGNOSIS — L608 Other nail disorders: Secondary | ICD-10-CM

## 2020-05-02 DIAGNOSIS — Z9889 Other specified postprocedural states: Secondary | ICD-10-CM | POA: Diagnosis not present

## 2020-05-02 DIAGNOSIS — B351 Tinea unguium: Secondary | ICD-10-CM

## 2020-05-02 DIAGNOSIS — M79674 Pain in right toe(s): Secondary | ICD-10-CM

## 2020-05-02 NOTE — Progress Notes (Signed)
Virtual Visit via Telephone Note  I connected with Harold Miller on 05/02/20 at  8:00 AM EST by telephone and verified that I am speaking with the correct person using two identifiers.  Location: Patient: Harold Miller (Home)  Provider: Asencion Islam, DPM ([facility] [place])     I discussed the limitations, risks, security and privacy concerns of performing an evaluation and management service by telephone and the availability of in person appointments. I also discussed with the patient that there may be a patient responsible charge related to this service. The patient expressed understanding and agreed to proceed.   History of Present Illness: 39 year old male patient status post total removal of right hallux nail with application of phenol performed in office on 04/18/2020.  Patient reports that he is continuing to soak and feels like things are improving and healing denies any current pain or any constitutional symptoms at this time.   Observations/Objective: Physical exam unable to be performed due to telephone nature of visit  Assessment and Plan: Problem List Items Addressed This Visit   None   Visit Diagnoses    S/P nail surgery    -  Primary   Nail deformity       Nail fungus       Toe pain, right         Advised patient to discontinue soaking at this time May leave procedure site open to air at bedtime however during the day continue to cover with antibiotic cream and Band-Aid until area has completely healed May wear comfortable shoes that do not rub toe Advised patient to closely monitor and to call office if fails to continue to improve as below  Follow Up Instructions: Return to office if fails to continue to improve or may reach out via MyChart for follow-up instructions or care   I discussed the assessment and treatment plan with the patient. The patient was provided an opportunity to ask questions and all were answered. The patient agreed  with the plan and demonstrated an understanding of the instructions.   The patient was advised to call back or seek an in-person evaluation if the symptoms worsen or if the condition fails to improve as anticipated.  I provided 6 minutes of non-face-to-face time during this encounter.   Asencion Islam, DPM
